# Patient Record
Sex: Female | Born: 1965 | Race: White | Hispanic: No | Marital: Married | Smoking: Never smoker
Health system: Southern US, Community
[De-identification: ages and names within clinical notes are randomized; demographics above are authoritative.]

## PROBLEM LIST (undated history)

## (undated) DIAGNOSIS — K219 Gastro-esophageal reflux disease without esophagitis: Secondary | ICD-10-CM

## (undated) DIAGNOSIS — M1990 Unspecified osteoarthritis, unspecified site: Secondary | ICD-10-CM

## (undated) DIAGNOSIS — K5909 Other constipation: Secondary | ICD-10-CM

## (undated) DIAGNOSIS — F329 Major depressive disorder, single episode, unspecified: Secondary | ICD-10-CM

## (undated) DIAGNOSIS — M8430XA Stress fracture, unspecified site, initial encounter for fracture: Secondary | ICD-10-CM

## (undated) DIAGNOSIS — R002 Palpitations: Secondary | ICD-10-CM

## (undated) DIAGNOSIS — J45909 Unspecified asthma, uncomplicated: Secondary | ICD-10-CM

## (undated) HISTORY — DX: Other constipation: K59.09

## (undated) HISTORY — DX: Palpitations: R00.2

## (undated) HISTORY — DX: Major depressive disorder, single episode, unspecified: F32.9

## (undated) HISTORY — DX: Stress fracture, unspecified site, initial encounter for fracture: M84.30XA

## (undated) HISTORY — DX: Gastro-esophageal reflux disease without esophagitis: K21.9

## (undated) HISTORY — DX: Unspecified asthma, uncomplicated: J45.909

## (undated) HISTORY — DX: Unspecified osteoarthritis, unspecified site: M19.90

---

## 1997-07-10 HISTORY — PX: DIAGNOSTIC LAPAROSCOPY: SUR761

## 1999-01-28 HISTORY — PX: VAGINAL HYSTERECTOMY: SUR661

## 2005-04-14 ENCOUNTER — Ambulatory Visit: Payer: Self-pay

## 2006-09-23 ENCOUNTER — Ambulatory Visit: Payer: Self-pay

## 2007-07-21 DIAGNOSIS — F32A Depression, unspecified: Secondary | ICD-10-CM

## 2007-07-21 HISTORY — DX: Depression, unspecified: F32.A

## 2009-04-04 ENCOUNTER — Ambulatory Visit: Payer: Self-pay

## 2010-04-22 ENCOUNTER — Ambulatory Visit: Payer: Self-pay

## 2011-07-29 ENCOUNTER — Ambulatory Visit: Payer: Self-pay

## 2011-08-07 ENCOUNTER — Ambulatory Visit: Payer: Self-pay

## 2012-02-11 ENCOUNTER — Ambulatory Visit: Payer: Self-pay | Admitting: General Surgery

## 2012-07-20 DIAGNOSIS — M8430XA Stress fracture, unspecified site, initial encounter for fracture: Secondary | ICD-10-CM

## 2012-07-20 HISTORY — DX: Stress fracture, unspecified site, initial encounter for fracture: M84.30XA

## 2015-12-03 ENCOUNTER — Other Ambulatory Visit: Payer: Self-pay | Admitting: Certified Nurse Midwife

## 2015-12-03 DIAGNOSIS — Z1231 Encounter for screening mammogram for malignant neoplasm of breast: Secondary | ICD-10-CM

## 2015-12-11 ENCOUNTER — Ambulatory Visit
Admission: RE | Admit: 2015-12-11 | Discharge: 2015-12-11 | Disposition: A | Discharge status: Home / Self Care | Payer: 59 | Source: Ambulatory Visit | Attending: Certified Nurse Midwife | Admitting: Certified Nurse Midwife

## 2015-12-11 DIAGNOSIS — Z1231 Encounter for screening mammogram for malignant neoplasm of breast: Secondary | ICD-10-CM | POA: Diagnosis not present

## 2016-02-24 ENCOUNTER — Encounter: Payer: Self-pay | Admitting: *Deleted

## 2016-03-02 ENCOUNTER — Encounter: Payer: Self-pay | Admitting: *Deleted

## 2016-03-05 ENCOUNTER — Ambulatory Visit: Payer: Self-pay | Admitting: General Surgery

## 2016-04-01 ENCOUNTER — Ambulatory Visit: Payer: Self-pay | Admitting: General Surgery

## 2016-12-25 ENCOUNTER — Ambulatory Visit: Payer: 59 | Admitting: Certified Nurse Midwife

## 2017-01-08 ENCOUNTER — Encounter: Payer: Self-pay | Admitting: Certified Nurse Midwife

## 2017-01-08 ENCOUNTER — Ambulatory Visit (INDEPENDENT_AMBULATORY_CARE_PROVIDER_SITE_OTHER): Payer: 59 | Admitting: Certified Nurse Midwife

## 2017-01-08 VITALS — BP 102/62 | HR 66 | Ht 64.5 in | Wt 124.0 lb

## 2017-01-08 DIAGNOSIS — R253 Fasciculation: Secondary | ICD-10-CM | POA: Diagnosis not present

## 2017-01-08 DIAGNOSIS — Z1231 Encounter for screening mammogram for malignant neoplasm of breast: Secondary | ICD-10-CM

## 2017-01-08 DIAGNOSIS — Z124 Encounter for screening for malignant neoplasm of cervix: Secondary | ICD-10-CM | POA: Diagnosis not present

## 2017-01-08 DIAGNOSIS — Z01419 Encounter for gynecological examination (general) (routine) without abnormal findings: Secondary | ICD-10-CM | POA: Diagnosis not present

## 2017-01-08 DIAGNOSIS — Z1239 Encounter for other screening for malignant neoplasm of breast: Secondary | ICD-10-CM

## 2017-01-08 NOTE — Progress Notes (Signed)
Gynecology Annual Exam  PCP: Foley, Shireen QuanStephanie J, MD  Chief Complaint:  Chief Complaint  Patient presents with  . Gynecologic Exam    History of Present Illness:Alexandria Bernard is a 51 year old Caucasian/White female , G 3 P 3 0 0 3 , who presents for her annual exam .  Her menses are absent due to a vaginal hysterectomy 01/28/1999 for prolapse, pelvic pain, and PAD. Her FSH and LH were elevated 01/08/2012. She has been having vaginal dryness intermittently and has tried Estrace cream for a short time, but decided to use lubricants only if needed for intercourse.  She has had no spotting.   The patient's past medical history is detailed in the past medical history section.  Since her last annual GYN exam dated 12/23/2015, she has been treated for fungal then bacterial infections around her mouth with topical medications. In the last month she has been having muscle twitchin in her arms, calves and thighs and has also had more pain in her joints and has weakness in her wrists.   Her most recent pap smear was obtained 11/16/2013 and was with negative cells and negative HPV DNA.  Her most recent mammogram obtained on 12/11/2015 was normal and revealed no significant changes.  There is a positive history of breast cancer in her maternal grandmother. Genetic testing has not been done.  There is no family history of ovarian cancer.  The patient does do occ monthly self breast exams.  She had colon cancer screening this year with Cologuard which she reports was negative. The patient does not smoke.  The patient does drink infrequently.  The patient does not use illegal drugs.  The patient exercises regularly-does both cardio and strength training The patient does not get adequate calcium in her diet and has not been taking her Calcium supplements  She had a recent cholesterol screen in 2018 by PCP that was normal.    .  The patient denies current symptoms of depression.    Review of  Systems: Review of Systems  Constitutional: Negative for chills, fever and weight loss.  HENT: Negative for congestion, sinus pain and sore throat.   Eyes: Negative for blurred vision and pain.  Respiratory: Negative for hemoptysis, shortness of breath and wheezing.   Cardiovascular: Negative for chest pain, palpitations and leg swelling.  Gastrointestinal: Positive for heartburn. Negative for abdominal pain, blood in stool, diarrhea, nausea and vomiting.  Genitourinary: Negative for dysuria, frequency, hematuria and urgency.       Positive for vaginal dryness  Musculoskeletal: Positive for joint pain. Negative for back pain and myalgias.       Positive for muscle twitching  Skin: Negative for itching and rash.  Neurological: Positive for focal weakness (in wrists). Negative for dizziness, tingling and headaches.  Endo/Heme/Allergies: Negative for environmental allergies and polydipsia. Does not bruise/bleed easily.       Negative for hirsutism   Psychiatric/Behavioral: Negative for depression. The patient is not nervous/anxious and does not have insomnia.     Past Medical History:  Past Medical History:  Diagnosis Date  . Acid reflux   . Arthritis    knee pain  . Asthma   . Depression 2009   mild  . Palpitations    PVC's  . Stress fracture 2014   left foot    Past Surgical History:  Past Surgical History:  Procedure Laterality Date  . DIAGNOSTIC LAPAROSCOPY  07/10/1997   and lysis of adhesions  .  VAGINAL HYSTERECTOMY  01/28/1999   prolapse, pelvic pain, PAD    Family History:  Family History  Problem Relation Age of Onset  . Breast cancer Maternal Grandmother 55       died from mets to brain  . Osteoporosis Mother   . Arthritis Mother   . Hypertension Mother   . Diabetes Father   . Hypertension Father   . Melanoma Father        on buttock  . Uterine cancer Paternal Aunt 50  . Diabetes Maternal Grandfather   . Heart disease Maternal Grandfather        died in  late 81s  . Crohn's disease Son 65  . Hypertension Son     Social History:  Social History   Social History  . Marital status: Married    Spouse name: N/A  . Number of children: 3  . Years of education: N/A   Occupational History  . Not on file.   Social History Main Topics  . Smoking status: Never Smoker  . Smokeless tobacco: Never Used  . Alcohol use Yes     Comment: rare  . Drug use: No  . Sexual activity: Yes    Birth control/ protection: Surgical     Comment: hysterectomy   Other Topics Concern  . Not on file   Social History Narrative  . No narrative on file    Allergies:  Allergies  Allergen Reactions  . Brassica Oleracea Italica Nausea Only  . Eggs Or Egg-Derived Products Nausea Only  . Penicillins Other (See Comments)    Medications: Prior to Admission medications   Medication Sig Start Date End Date Taking? Authorizing Provider  albuterol (PROVENTIL HFA;VENTOLIN HFA) 108 (90 Base) MCG/ACT inhaler Inhale into the lungs. 03/16/16 03/16/17 Yes [provider]  clobetasol ointment (TEMOVATE) 0.05 %  06/24/16  Yes [provider]  fluticasone (FLONASE) 50 MCG/ACT nasal spray fluticasone 50 mcg/actuation nasal spray,suspension   Yes [provider]  ibuprofen (ADVIL,MOTRIN) 600 MG tablet Take 600 mg by mouth. 03/16/16 03/16/17 Yes [provider]  metroNIDAZOLE (METROCREAM) 0.75 % cream  06/24/16  Yes [provider]  miconazole (MICOTIN) 2 % cream Apply 1 application topically 2 (two) times daily.   Yes [provider]  omeprazole (PRILOSEC) 40 MG capsule Take 40 mg by mouth. 05/26/16  Yes [provider]    Physical Exam Vitals: BP 102/62   Pulse 66   Ht 5' 4.5" (1.638 m)   Wt 56.2 kg (124 lb)   LMP  (Exact Date)   BMI 20.96 kg/m   General: WF in NAD HEENT: normocephalic, anicteric Neck: no thyroid enlargement, no palpable nodules, no cervical lymphadenopathy  Pulmonary: No increased work  of breathing, CTAB Cardiovascular: RRR, without murmur  Breast: Breast symmetrical, no tenderness, no palpable nodules or masses, no skin or nipple retraction present, no nipple discharge.  No axillary, infraclavicular or supraclavicular lymphadenopathy. Abdomen: Soft, non-tender, non-distended.  Umbilicus without lesions.  No hepatomegaly or masses palpable. No evidence of hernia. Genitourinary:  External: Normal external female genitalia.  Normal urethral meatus, normal Bartholin's and Skene's glands.    Vagina: pale, flattened rugae, no evidence of prolapse.    Cervix: surgically absent  Uterus: surgically absent  Adnexa: No adnexal masses, non-tender  Rectal: deferred  Lymphatic: no evidence of inguinal lymphadenopathy Extremities: no edema, erythema, or tenderness Neurologic: Grossly intact Psychiatric: mood appropriate, affect full     Assessment: 52 y.o. [id number] well woman exam Muscle twitching  Plan:    1) Breast cancer screening - recommend monthly self breast exam and annual mammograms. Mammogram was ordered today.  2)  Pap was done. ASCCP guidelines and rational discussed.  Patient opts for every 3 years screening interval  3) Colon cancer screening-done this year: Patient plans Cologuard q3 years.   4 Routine healthcare maintenance including cholesterol and diabetes screening managed by PCP. TSH ordered for muscle twitching  Farrel Conners, CNM

## 2017-01-09 ENCOUNTER — Encounter: Payer: Self-pay | Admitting: Certified Nurse Midwife

## 2017-01-09 DIAGNOSIS — M1990 Unspecified osteoarthritis, unspecified site: Secondary | ICD-10-CM | POA: Insufficient documentation

## 2017-01-09 DIAGNOSIS — K219 Gastro-esophageal reflux disease without esophagitis: Secondary | ICD-10-CM | POA: Insufficient documentation

## 2017-01-09 DIAGNOSIS — J45909 Unspecified asthma, uncomplicated: Secondary | ICD-10-CM | POA: Insufficient documentation

## 2017-01-09 LAB — TSH: TSH: 1.38 u[IU]/mL (ref 0.450–4.500)

## 2017-01-11 LAB — IGP,RFX APTIMA HPV ALL PTH: PAP SMEAR COMMENT: 0

## 2017-03-01 ENCOUNTER — Ambulatory Visit
Admission: RE | Admit: 2017-03-01 | Discharge: 2017-03-01 | Disposition: A | Discharge status: Home / Self Care | Payer: 59 | Source: Ambulatory Visit | Attending: Certified Nurse Midwife | Admitting: Certified Nurse Midwife

## 2017-03-01 DIAGNOSIS — Z1231 Encounter for screening mammogram for malignant neoplasm of breast: Secondary | ICD-10-CM | POA: Insufficient documentation

## 2017-03-01 DIAGNOSIS — Z1239 Encounter for other screening for malignant neoplasm of breast: Secondary | ICD-10-CM

## 2017-10-13 ENCOUNTER — Ambulatory Visit: Payer: 59 | Admitting: Certified Nurse Midwife

## 2017-12-30 HISTORY — PX: COLONOSCOPY: SHX174

## 2018-01-17 DIAGNOSIS — K5909 Other constipation: Secondary | ICD-10-CM

## 2018-01-17 HISTORY — DX: Other constipation: K59.09

## 2018-04-20 ENCOUNTER — Other Ambulatory Visit: Payer: Self-pay | Admitting: Certified Nurse Midwife

## 2018-04-20 DIAGNOSIS — Z1231 Encounter for screening mammogram for malignant neoplasm of breast: Secondary | ICD-10-CM

## 2018-05-10 NOTE — Progress Notes (Signed)
Gynecology Annual Exam  PCP: Foley, Shireen Quan, MD  Chief Complaint:  Chief Complaint  Patient presents with  . Gynecologic Exam    History of Present Illness:Alexandria Bernard is a 52 year old Caucasian/White female , G 3 P 3 0 0 3 , who presents for her annual exam .  Her menses are absent due to a vaginal hysterectomy 01/28/1999 for prolapse, pelvic pain, and PAD. Her FSH and LH were elevated 01/08/2012. She has been having vaginal dryness intermittently and has tried Estrace cream for a short time, but decided to use lubricants only if needed for intercourse.  She has had no spotting.   The patient's past medical history is detailed in the past medical history section.  Since her last annual GYN exam dated 01/08/17, she has had a workup for RLQ pain and was diagnosed with chronic constipation. She has increased her fiber, water intake and exercise and has had almost a complete resolution of the RLQ pain.   Her most recent pap smear was obtained 01/08/2017 and was NIL.  Her most recent mammogram obtained on 03/01/2017 was normal and revealed no significant changes.  There is a positive history of breast cancer in her maternal grandmother. Genetic testing is not indicated.  There is no family history of ovarian cancer.  The patient does do occ monthly self breast exams.  She had a colonoscopy 12/30/2017 which was normal.  The patient does not smoke.  The patient does drink infrequently.  The patient does not use illegal drugs.  The patient exercises regularly-does both cardio and strength training The patient does not get adequate calcium in her diet and has not been taking her Calcium supplements  She had a recent cholesterol screen in 2019 by PCP that was normal. .  The patient denies current symptoms of depression.    Review of Systems: Review of Systems  Constitutional: Positive for malaise/fatigue. Negative for chills, fever and weight loss.  HENT: Negative for  congestion, sinus pain and sore throat.   Eyes: Negative for blurred vision and pain.  Respiratory: Negative for hemoptysis, shortness of breath and wheezing.   Cardiovascular: Positive for palpitations. Negative for chest pain and leg swelling.  Gastrointestinal: Positive for constipation and heartburn. Negative for abdominal pain, blood in stool, diarrhea, nausea and vomiting.  Genitourinary: Negative for dysuria, frequency, hematuria and urgency.       Positive for vaginal dryness  Musculoskeletal: Positive for joint pain. Negative for back pain and myalgias.       Positive for muscle twitching  Skin: Positive for rash. Negative for itching.  Neurological: Negative for dizziness, tingling and headaches. Focal weakness: in wrists.  Endo/Heme/Allergies: Negative for environmental allergies and polydipsia. Does not bruise/bleed easily.       Negative for hirsutism   Psychiatric/Behavioral: Negative for depression. The patient is nervous/anxious. The patient does not have insomnia.     Past Medical History:  Past Medical History:  Diagnosis Date  . Acid reflux   . Arthritis    knee pain  . Asthma   . Chronic constipation 01/2018  . Depression 2009   mild  . Palpitations    PVC's  . Stress fracture 2014   left foot    Past Surgical History:  Past Surgical History:  Procedure Laterality Date  . COLONOSCOPY  12/30/2017  . DIAGNOSTIC LAPAROSCOPY  07/10/1997   and lysis of adhesions  . VAGINAL HYSTERECTOMY  01/28/1999   prolapse, pelvic pain, PAD  Family History:  Family History  Problem Relation Age of Onset  . Breast cancer Maternal Grandmother 55       died from mets to brain  . Osteoporosis Mother   . Arthritis Mother   . Hypertension Mother   . Diabetes Father   . Hypertension Father   . Melanoma Father        on buttock  . Glaucoma Father   . Uterine cancer Paternal Aunt 50  . Diabetes Maternal Grandfather   . Heart disease Maternal Grandfather        died  in late 44s  . Crohn's disease Son 56  . Hypertension Son     Social History:  Social History   Socioeconomic History  . Marital status: Married    Spouse name: Not on file  . Number of children: 3  . Years of education: 59  . Highest education level: Not on file  Occupational History  . Occupation: self employed  Social Needs  . Financial resource strain: Not on file  . Food insecurity:    Worry: Not on file    Inability: Not on file  . Transportation needs:    Medical: Not on file    Non-medical: Not on file  Tobacco Use  . Smoking status: Never Smoker  . Smokeless tobacco: Never Used  Substance and Sexual Activity  . Alcohol use: Yes    Comment: rare  . Drug use: No  . Sexual activity: Yes    Birth control/protection: Surgical    Comment: hysterectomy  Lifestyle  . Physical activity:    Days per week: Not on file    Minutes per session: Not on file  . Stress: Not on file  Relationships  . Social connections:    Talks on phone: Not on file    Gets together: Not on file    Attends religious service: Not on file    Active member of club or organization: Not on file    Attends meetings of clubs or organizations: Not on file    Relationship status: Not on file  . Intimate partner violence:    Fear of current or ex partner: Not on file    Emotionally abused: Not on file    Physically abused: Not on file    Forced sexual activity: Not on file  Other Topics Concern  . Not on file  Social History Narrative  . Not on file    Allergies:  Allergies  Allergen Reactions  . Brassica Oleracea Italica Nausea Only and Other (See Comments)    intolerance   . Eggs Or Egg-Derived Products Nausea Only  . Penicillins Other (See Comments)    Childhood reaction unknown-- but not tolerated  . Avobenzone   . Other Nausea Only    avocados cause severe stomach pain    Medications: Current Outpatient Medications on File Prior to Visit  Medication Sig Dispense Refill  .  Cholecalciferol (VITAMIN D3) 5000 units TABS Take 1 tablet by mouth daily.    . clobetasol ointment (TEMOVATE) 0.05 %     . diclofenac sodium (VOLTAREN) 1 % GEL Apply 1 application topically as needed.    . levalbuterol (XOPENEX HFA) 45 MCG/ACT inhaler Place 1 puff into the nose as needed.    . metroNIDAZOLE (METROCREAM) 0.75 % cream     . miconazole (MICOTIN) 2 % cream Apply 1 application topically 2 (two) times daily.    . mupirocin ointment (BACTROBAN) 2 % Apply 1 application topically  daily.    . omeprazole (PRILOSEC) 40 MG capsule Take 40 mg by mouth.    . vitamin B-12 (CYANOCOBALAMIN) 1000 MCG tablet Take 1,000 mcg by mouth daily.    Marland Kitchen albuterol (PROVENTIL HFA;VENTOLIN HFA) 108 (90 Base) MCG/ACT inhaler Inhale into the lungs.     No current facility-administered medications on file prior to visit.    Physical Exam Vitals: BP 110/60   Pulse 60   Ht 5' 4.5" (1.638 m)   Wt 123 lb 8 oz (56 kg)   BMI 20.87 kg/m   General: WF in NAD HEENT: normocephalic, anicteric Neck: no thyroid enlargement, no palpable nodules, no cervical lymphadenopathy  Pulmonary: No increased work of breathing, CTAB Cardiovascular: RRR, without murmur  Breast: Breast symmetrical, no tenderness, no palpable nodules or masses, no skin or nipple retraction present, no nipple discharge.  No axillary, infraclavicular or supraclavicular lymphadenopathy. Abdomen: Soft, non-tender, non-distended.  Umbilicus without lesions.  No hepatomegaly or masses palpable. No evidence of hernia. Genitourinary:  External: Normal external female genitalia.  Normal urethral meatus, normal Bartholin's and Skene's glands.   Vagina: flattened rugae, no evidence of prolapse.    Cervix: surgically absent  Uterus: surgically absent  Adnexa: No adnexal masses, non-tender  Rectal: deferred  Lymphatic: no evidence of inguinal lymphadenopathy Extremities: no edema, erythema, or tenderness in LE. Wearing left wrist brace for pain at base of  thumb Neurologic: Grossly intact Psychiatric: mood appropriate, affect full     Assessment: 52 y.o. [id number] well woman exam   Plan:    1) Breast cancer screening - recommend monthly self breast exam and annual mammograms. Mammogram is scheduled for this afternoon.  2)  Pap was not done. ASCCP guidelines and rational discussed.  Patient opts for every 3 years screening interval. Next due in 2 years.  3) Colon cancer screening- colonoscopy this year was normal.  4 Routine healthcare maintenance including cholesterol and diabetes screening managed by PCP.   5) RTO in 1 year and prn  Farrel Conners, CNM

## 2018-05-11 ENCOUNTER — Encounter: Payer: Self-pay | Admitting: Certified Nurse Midwife

## 2018-05-11 ENCOUNTER — Ambulatory Visit
Admission: RE | Admit: 2018-05-11 | Discharge: 2018-05-11 | Disposition: A | Discharge status: Home / Self Care | Payer: Managed Care, Other (non HMO) | Source: Ambulatory Visit | Attending: Certified Nurse Midwife | Admitting: Certified Nurse Midwife

## 2018-05-11 ENCOUNTER — Ambulatory Visit (INDEPENDENT_AMBULATORY_CARE_PROVIDER_SITE_OTHER): Payer: Managed Care, Other (non HMO) | Admitting: Certified Nurse Midwife

## 2018-05-11 VITALS — BP 110/60 | HR 60 | Ht 64.5 in | Wt 123.5 lb

## 2018-05-11 DIAGNOSIS — Z01419 Encounter for gynecological examination (general) (routine) without abnormal findings: Secondary | ICD-10-CM

## 2018-05-11 DIAGNOSIS — Z1239 Encounter for other screening for malignant neoplasm of breast: Secondary | ICD-10-CM

## 2018-05-11 DIAGNOSIS — Z1231 Encounter for screening mammogram for malignant neoplasm of breast: Secondary | ICD-10-CM | POA: Diagnosis not present

## 2019-04-20 ENCOUNTER — Other Ambulatory Visit: Payer: Self-pay | Admitting: Certified Nurse Midwife

## 2019-04-20 DIAGNOSIS — Z1231 Encounter for screening mammogram for malignant neoplasm of breast: Secondary | ICD-10-CM

## 2019-05-17 ENCOUNTER — Encounter (INDEPENDENT_AMBULATORY_CARE_PROVIDER_SITE_OTHER): Payer: Self-pay

## 2019-05-17 ENCOUNTER — Ambulatory Visit
Admission: RE | Admit: 2019-05-17 | Discharge: 2019-05-17 | Disposition: A | Discharge status: Home / Self Care | Payer: 59 | Source: Ambulatory Visit | Attending: Certified Nurse Midwife | Admitting: Certified Nurse Midwife

## 2019-05-17 ENCOUNTER — Other Ambulatory Visit: Payer: Self-pay | Admitting: Certified Nurse Midwife

## 2019-05-17 ENCOUNTER — Other Ambulatory Visit: Payer: Self-pay

## 2019-05-17 DIAGNOSIS — N6489 Other specified disorders of breast: Secondary | ICD-10-CM

## 2019-05-17 DIAGNOSIS — Z1231 Encounter for screening mammogram for malignant neoplasm of breast: Secondary | ICD-10-CM | POA: Diagnosis not present

## 2019-05-17 DIAGNOSIS — R928 Other abnormal and inconclusive findings on diagnostic imaging of breast: Secondary | ICD-10-CM

## 2019-05-24 ENCOUNTER — Encounter: Payer: Self-pay | Admitting: Certified Nurse Midwife

## 2019-05-24 ENCOUNTER — Other Ambulatory Visit: Payer: Self-pay

## 2019-05-24 ENCOUNTER — Ambulatory Visit (INDEPENDENT_AMBULATORY_CARE_PROVIDER_SITE_OTHER): Payer: 59 | Admitting: Certified Nurse Midwife

## 2019-05-24 VITALS — BP 106/80 | HR 79 | Ht 65.0 in | Wt 117.0 lb

## 2019-05-24 DIAGNOSIS — Z01419 Encounter for gynecological examination (general) (routine) without abnormal findings: Secondary | ICD-10-CM

## 2019-05-24 DIAGNOSIS — Z1239 Encounter for other screening for malignant neoplasm of breast: Secondary | ICD-10-CM

## 2019-05-24 NOTE — Progress Notes (Signed)
Gynecology Annual Exam  PCP: Tester, Micah Flesher, PA-C  Chief Complaint:  Chief Complaint  Patient presents with  . Gynecologic Exam    has area on skin     History of Present Illness:Alexandria Bernard is a 53 year old Caucasian/White female , G 3 P 3 0 0 3 , who presents for her annual exam .  Her menses are absent due to a vaginal hysterectomy 01/28/1999 for prolapse, pelvic pain, and PAD. Her [place] and LH were elevated 01/08/2012. She has been having vaginal dryness intermittently and has tried Estrace cream for a short time, but decided to use lubricants only if needed for intercourse.  She has had no spotting.   The patient's past medical history is detailed in the past medical history section.  Since her last annual GYN exam dated 05/11/2018, she has recovered from a Covid 19 infection and has had Moh's surgery on her nose for skin cancer  Her most recent pap smear was obtained 01/08/2017 and was NIL.  Her most recent mammogram obtained on 05/11/2018 and was negative. She had a mammogram 1028/20 which was Birads 0. She needs additional views for left breast asymmetry.  There is a positive history of breast cancer in her maternal grandmother. Genetic testing is not indicated.  There is no family history of ovarian cancer.  The patient does do occ monthly self breast exams.  She had a colonoscopy 12/30/2017 which was normal.  The patient does not smoke.  The patient does drink infrequently.  The patient does not use illegal drugs.  The patient exercises regularly-does both cardio and strength training The patient may not get adequate calcium in her diet and has not been taking her Calcium supplements  She thinks she may have had a DEXA scan in the past which revealed osteopenia. She had a recent cholesterol screen in 2020 by PCP that was normal. .  The patient denies current symptoms of depression.    Review of Systems: Review of Systems  Constitutional: Negative for chills,  fever, malaise/fatigue and weight loss.  HENT: Negative for congestion, sinus pain and sore throat.   Eyes: Negative for blurred vision and pain.  Respiratory: Negative for hemoptysis, shortness of breath and wheezing.   Cardiovascular: Negative for chest pain, palpitations and leg swelling.  Gastrointestinal: Positive for heartburn. Negative for abdominal pain, blood in stool, constipation, diarrhea, nausea and vomiting.  Genitourinary: Negative for dysuria, frequency, hematuria and urgency.       Positive for vaginal dryness  Musculoskeletal: Positive for joint pain. Negative for back pain and myalgias.  Skin: Negative for itching and rash.  Neurological: Positive for headaches. Negative for dizziness and tingling. Focal weakness: in wrists.  Endo/Heme/Allergies: Negative for environmental allergies and polydipsia. Does not bruise/bleed easily.       Negative for hirsutism   Psychiatric/Behavioral: Negative for depression. The patient is not nervous/anxious and does not have insomnia.     Past Medical History:  Past Medical History:  Diagnosis Date  . Acid reflux   . Arthritis    knee pain  . Asthma   . Chronic constipation 01/2018  . Depression 2009   mild  . Palpitations    PVC's  . Stress fracture 2014   left foot    Past Surgical History:  Past Surgical History:  Procedure Laterality Date  . COLONOSCOPY  12/30/2017  . DIAGNOSTIC LAPAROSCOPY  07/10/1997   and lysis of adhesions  . VAGINAL HYSTERECTOMY  01/28/1999  prolapse, pelvic pain, PAD    Family History:  Family History  Problem Relation Age of Onset  . Breast cancer Maternal Grandmother 55       died from mets to brain  . Osteoporosis Mother   . Arthritis Mother   . Hypertension Mother   . Diabetes Father   . Hypertension Father   . Melanoma Father        on buttock  . Glaucoma Father   . Uterine cancer Paternal Aunt 50  . Diabetes Maternal Grandfather   . Heart disease Maternal Grandfather         died in late 31s  . Crohn's disease Son 49  . Hypertension Son     Social History:  Social History   Socioeconomic History  . Marital status: Married    Spouse name: Not on file  . Number of children: 3  . Years of education: 85  . Highest education level: Not on file  Occupational History  . Occupation: self employed  Social Needs  . Financial resource strain: Not on file  . Food insecurity    Worry: Not on file    Inability: Not on file  . Transportation needs    Medical: Not on file    Non-medical: Not on file  Tobacco Use  . Smoking status: Never Smoker  . Smokeless tobacco: Never Used  Substance and Sexual Activity  . Alcohol use: Yes    Comment: rare  . Drug use: No  . Sexual activity: Yes    Birth control/protection: Surgical    Comment: hysterectomy  Lifestyle  . Physical activity    Days per week: Not on file    Minutes per session: Not on file  . Stress: Not on file  Relationships  . Social Musician on phone: Not on file    Gets together: Not on file    Attends religious service: Not on file    Active member of club or organization: Not on file    Attends meetings of clubs or organizations: Not on file    Relationship status: Not on file  . Intimate partner violence    Fear of current or ex partner: Not on file    Emotionally abused: Not on file    Physically abused: Not on file    Forced sexual activity: Not on file  Other Topics Concern  . Not on file  Social History Narrative  . Not on file    Allergies:  Allergies  Allergen Reactions  . Brassica Oleracea Nausea Only and Other (See Comments)    intolerance   . Eggs Or Egg-Derived Products Nausea Only  . Penicillins Other (See Comments)    Childhood reaction unknown-- but not tolerated  . Other Nausea Only    avocados cause severe stomach pain; brocolli    Medications: Current Outpatient Medications on File Prior to Visit  Medication Sig Dispense Refill  . clobetasol  ointment (TEMOVATE) 0.05 %     . cyclobenzaprine (FLEXERIL) 5 MG tablet Take 1 tablet by mouth as needed.    . fluorouracil (EFUDEX) 5 % cream Apply 1 application topically 2 (two) times daily.    . metroNIDAZOLE (METROCREAM) 0.75 % cream     . miconazole (MICOTIN) 2 % cream Apply 1 application topically 2 (two) times daily.    . mupirocin ointment (BACTROBAN) 2 % Apply 1 application topically daily.    Marland Kitchen albuterol (PROVENTIL HFA;VENTOLIN HFA) 108 (90 Base) MCG/ACT inhaler  Inhale into the lungs.     No current facility-administered medications on file prior to visit.    Physical Exam Vitals: BP 106/80   Pulse 79   Ht 5\' 5"  (1.651 m)   Wt 117 lb (53.1 kg)   BMI 19.47 kg/m   General: WF in NAD HEENT: normocephalic, anicteric Neck: no thyroid enlargement, no palpable nodules, no cervical lymphadenopathy  Pulmonary: No increased work of breathing, CTAB Cardiovascular: RRR, without murmur  Breast: Breast symmetrical, no tenderness, no palpable nodules or masses, no skin or nipple retraction present, no nipple discharge.  No axillary, infraclavicular or supraclavicular lymphadenopathy. Abdomen: Soft, non-tender, non-distended.  Umbilicus without lesions.  No hepatomegaly or masses palpable. No evidence of hernia. Genitourinary:  External: Normal external female genitalia.  Normal urethral meatus, normal Bartholin's and Skene's glands.   Vagina: flattened rugae, no evidence of prolapse.    Cervix: surgically absent  Uterus: surgically absent  Adnexa: No adnexal masses, non-tender  Rectal: deferred  Lymphatic: no evidence of inguinal lymphadenopathy Extremities: no edema, erythema, or tenderness in LE. Wearing left wrist brace for pain at base of thumb Neurologic: Grossly intact Psychiatric: mood appropriate, affect full     Assessment: 53 y.o. [id number] well woman exam   Plan:    1) Breast cancer screening - recommend monthly self breast exam and annual mammograms. Additional  views ordered for recent Birads 0 mammogram  2)  Pap was not done. ASCCP guidelines and rational discussed.  Patient opts for every 3 years screening interval. Next due in 1 year.  3) Colon cancer screening- colonoscopy done last year  4 Routine healthcare maintenance including cholesterol and diabetes screening managed by PCP.   5) RTO in 1 year and prn  Farrel Connersolleen Aala Ransom, CNM

## 2019-05-30 ENCOUNTER — Ambulatory Visit
Admission: RE | Admit: 2019-05-30 | Discharge: 2019-05-30 | Disposition: A | Discharge status: Home / Self Care | Payer: 59 | Source: Ambulatory Visit | Attending: Certified Nurse Midwife | Admitting: Certified Nurse Midwife

## 2019-05-30 ENCOUNTER — Encounter: Payer: Self-pay | Admitting: Certified Nurse Midwife

## 2019-05-30 DIAGNOSIS — R928 Other abnormal and inconclusive findings on diagnostic imaging of breast: Secondary | ICD-10-CM | POA: Insufficient documentation

## 2019-05-30 DIAGNOSIS — N6489 Other specified disorders of breast: Secondary | ICD-10-CM

## 2020-08-02 IMAGING — MG MM DIGITAL DIAGNOSTIC UNILAT*L* W/ TOMO W/ CAD
6 series · 6 of 18 positions shown · non-contrast
Comparison: 05/17/2019 and earlier

CLINICAL DATA: Patient returns after screening study for evaluation
of possible LEFT breast asymmetry.

EXAM:
DIGITAL DIAGNOSTIC LEFT MAMMOGRAM WITH CAD AND TOMO
ULTRASOUND LEFT BREAST

[L ML synth-2D]
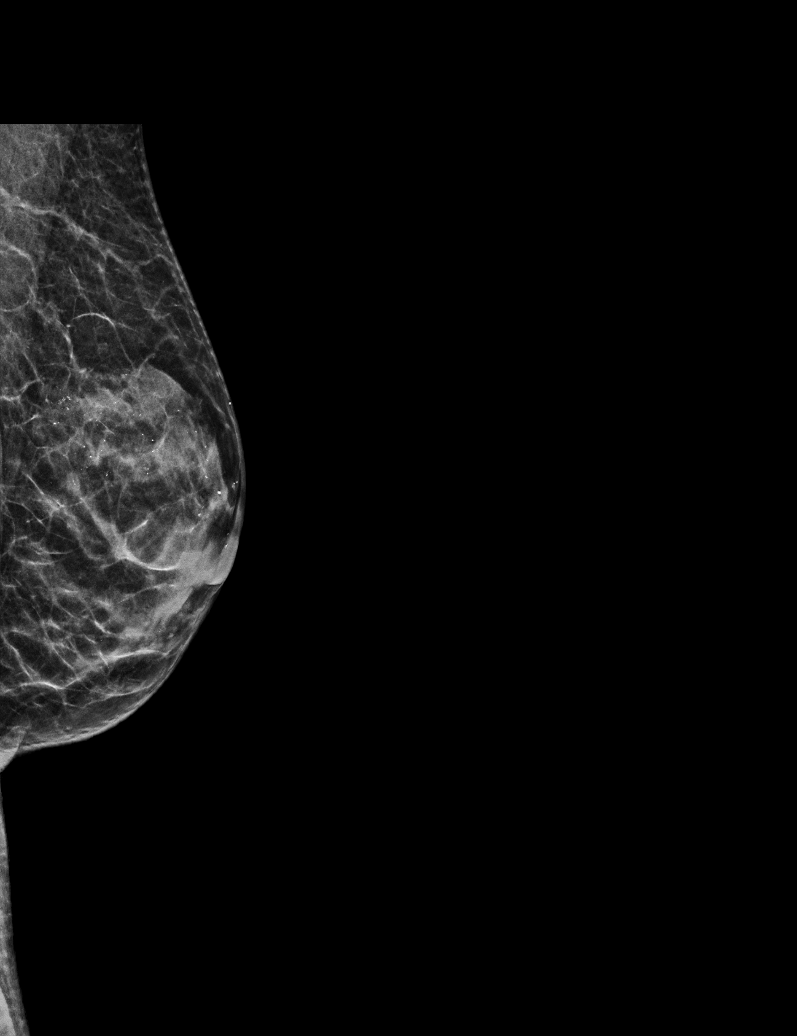

[L CC synth-2D (1 of 2)]
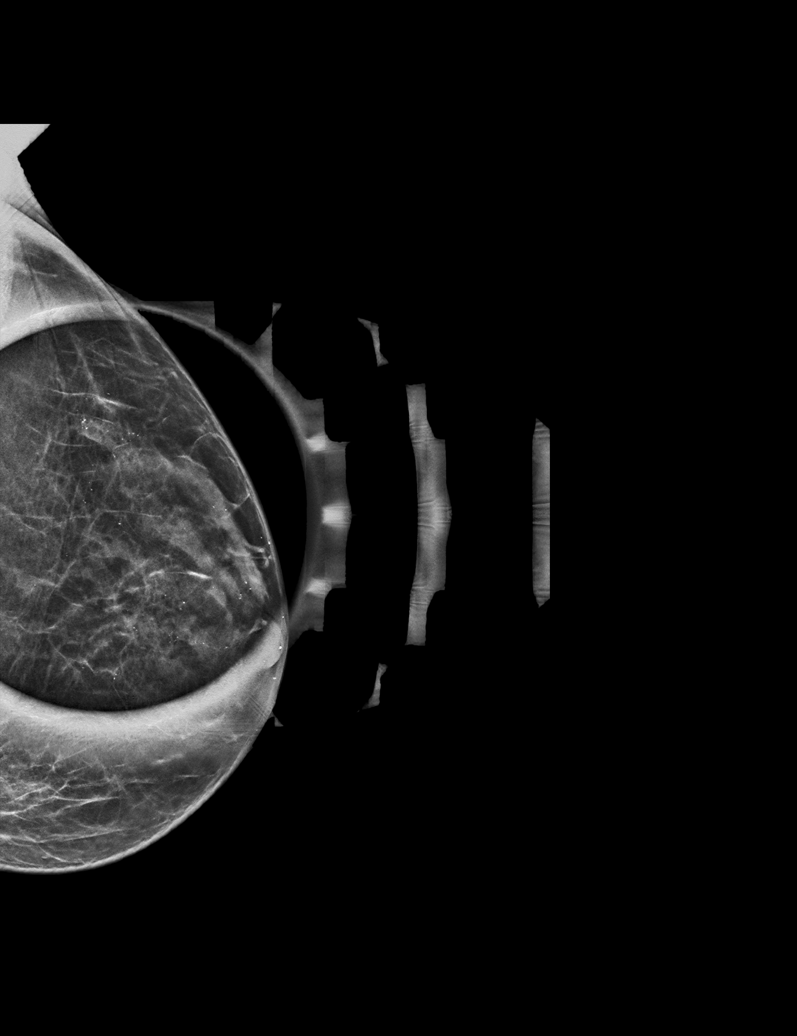

[L CC synth-2D (2 of 2)]
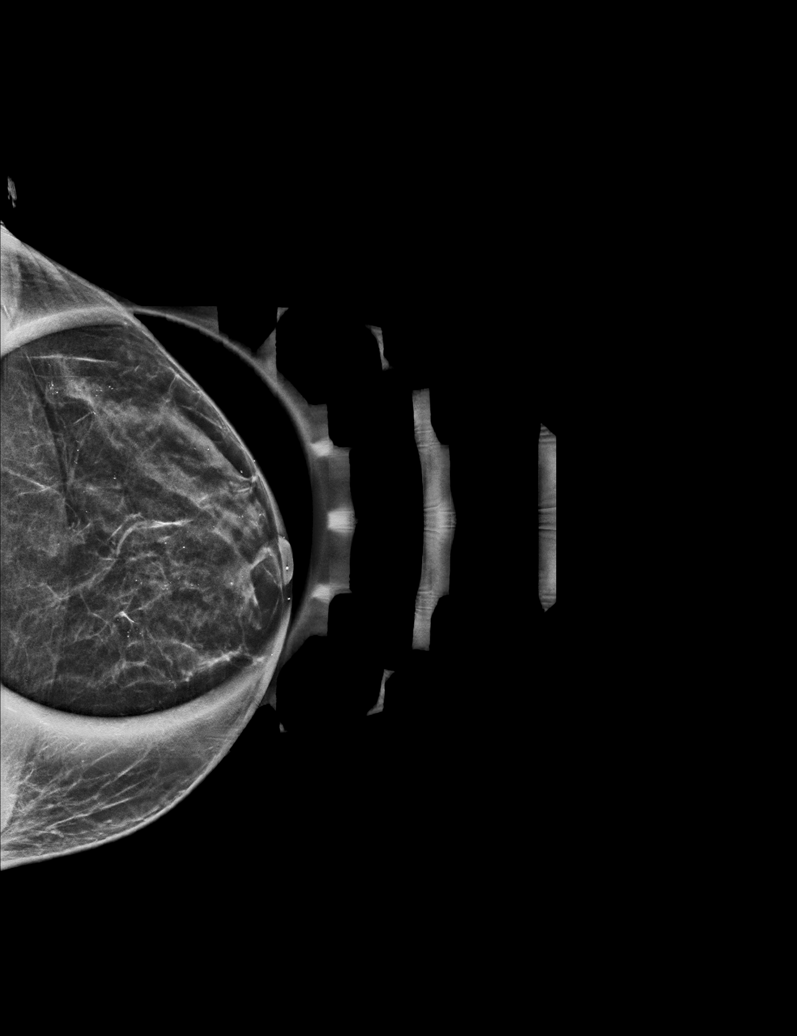

[L ML tomo · tomo slice 19/36.0]
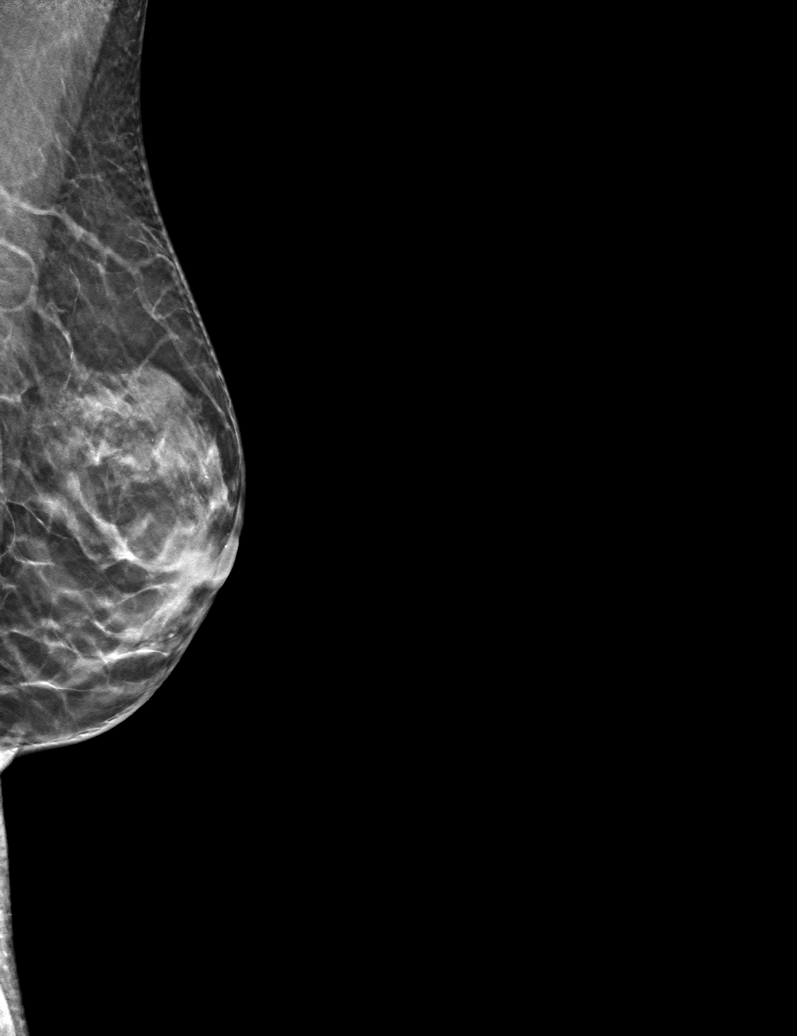

[L CC tomo (1 of 2) · tomo slice 19/37.0]
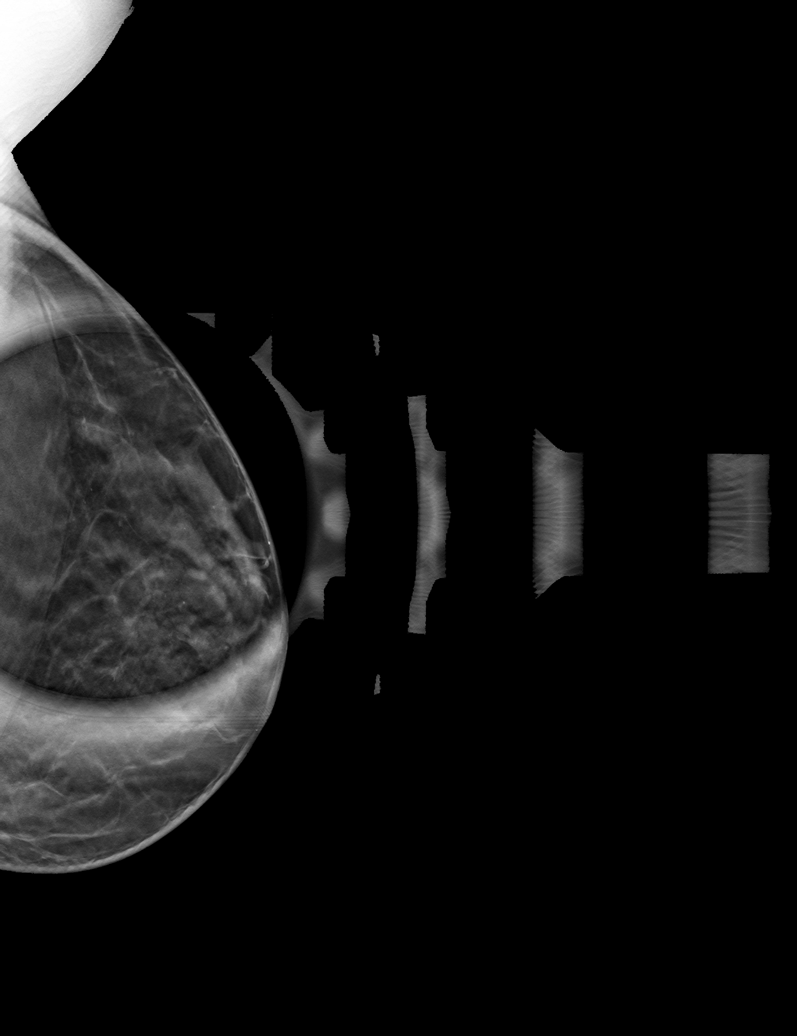

[L CC tomo (2 of 2) · tomo slice 20/39.0]
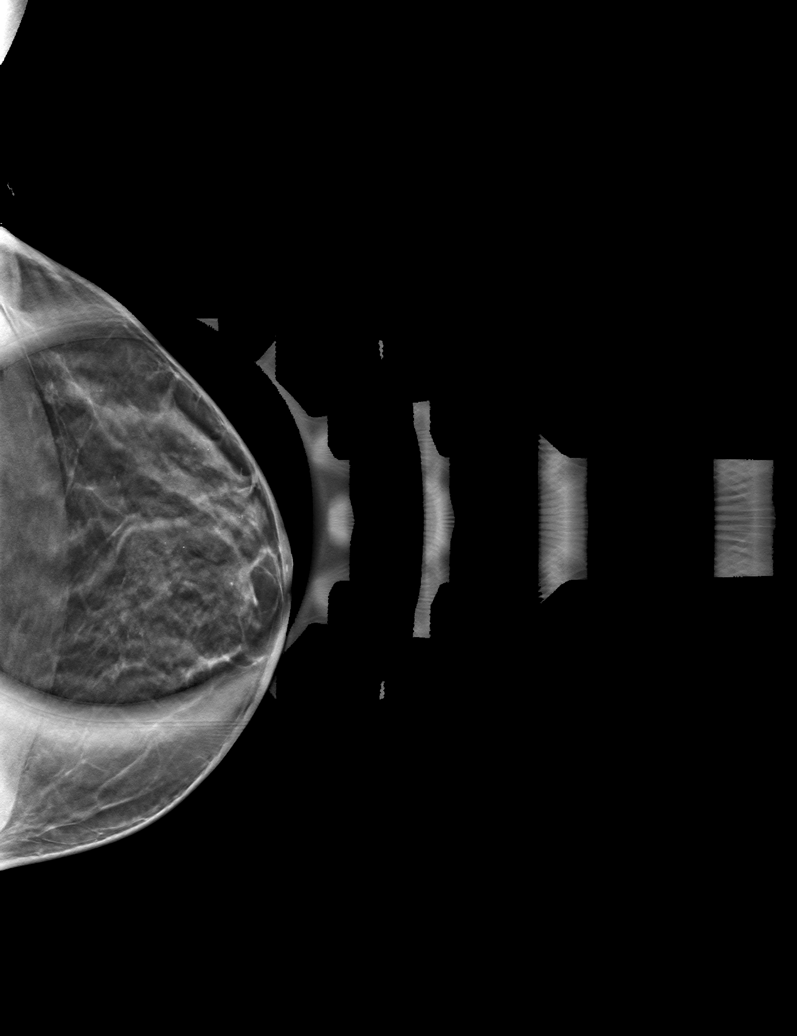

[6 of 18 positions shown; findings below may reference images not displayed]

ACR Breast Density Category c: The breast tissue is heterogeneously
dense, which may obscure small masses.
FINDINGS: Additional 2-D and 3-D images are performed. These views show no
mass in the slightly LATERAL portion of the LEFT breast. Scattered
fibroglandular densities are identified in this region.

Mammographic images were processed with CAD.

On physical exam, I palpate no abnormality in the LOWER OUTER
QUADRANT of the LEFT breast.

Targeted ultrasound is performed, showing normal appearing dense
fibroglandular tissue in the LOWER OUTER QUADRANT of the LEFT
breast. No suspicious mass, distortion, or acoustic shadowing is
demonstrated with ultrasound.
IMPRESSION: No mammographic or ultrasound evidence for malignancy.

RECOMMENDATION:
Screening mammogram in one year.(Code:[id number])

I have discussed the findings and recommendations with the patient.
If applicable, a reminder letter will be sent to the patient
regarding the next appointment.

BI-RADS CATEGORY  1: Negative.
